# Patient Record
Sex: Female | Born: 1945 | Race: White | Hispanic: No | Smoking: Former smoker
Health system: Southern US, Community
[De-identification: ages and names within clinical notes are randomized; demographics above are authoritative.]

## PROBLEM LIST (undated history)

## (undated) DIAGNOSIS — N189 Chronic kidney disease, unspecified: Secondary | ICD-10-CM

## (undated) DIAGNOSIS — I4719 Other supraventricular tachycardia: Secondary | ICD-10-CM

## (undated) DIAGNOSIS — I471 Supraventricular tachycardia: Secondary | ICD-10-CM

## (undated) DIAGNOSIS — F32A Depression, unspecified: Secondary | ICD-10-CM

## (undated) DIAGNOSIS — I10 Essential (primary) hypertension: Secondary | ICD-10-CM

## (undated) HISTORY — PX: CARPAL TUNNEL RELEASE: SHX101

## (undated) HISTORY — PX: INSERT / REPLACE / REMOVE PACEMAKER: SUR710

## (undated) HISTORY — PX: BREAST SURGERY: SHX581

---

## 1978-05-12 HISTORY — PX: NOSE SURGERY: SHX723

## 1983-05-13 HISTORY — PX: TUBAL LIGATION: SHX77

## 2011-05-13 HISTORY — PX: BLADDER SUSPENSION: SHX72

## 2014-06-12 DIAGNOSIS — Z950 Presence of cardiac pacemaker: Secondary | ICD-10-CM

## 2014-06-12 HISTORY — DX: Presence of cardiac pacemaker: Z95.0

## 2014-06-12 HISTORY — PX: PACEMAKER INSERTION: SHX728

## 2017-01-29 HISTORY — PX: TRIGGER FINGER RELEASE: SHX641

## 2019-06-05 ENCOUNTER — Ambulatory Visit: Payer: Medicare Other | Attending: Internal Medicine

## 2019-06-05 ENCOUNTER — Ambulatory Visit: Payer: Self-pay

## 2019-06-05 DIAGNOSIS — Z23 Encounter for immunization: Secondary | ICD-10-CM | POA: Insufficient documentation

## 2019-06-05 NOTE — Progress Notes (Signed)
   Covid-19 Vaccination Clinic  Name:  Ashleymarie Granderson    MRN: [id number] DOB: 1945-09-15  06/05/2019  Ms. Boer was observed post Covid-19 immunization for 15 minutes without incidence. She was provided with Vaccine Information Sheet and instruction to access the V-Safe system.   Ms. Huckeba was instructed to call 911 with any severe reactions post vaccine: Marland Kitchen Difficulty breathing  . Swelling of your face and throat  . A fast heartbeat  . A bad rash all over your body  . Dizziness and weakness    Immunizations Administered    Name Date Dose VIS Date Route   Pfizer COVID-19 Vaccine 06/05/2019  2:28 PM 0.3 mL 04/22/2019 Intramuscular   Manufacturer: [employer], [employer]   Lot: [id number]   NDC: [address]-9

## 2019-06-27 ENCOUNTER — Ambulatory Visit: Payer: Medicare Other | Attending: Internal Medicine

## 2019-06-27 DIAGNOSIS — Z23 Encounter for immunization: Secondary | ICD-10-CM | POA: Insufficient documentation

## 2019-06-27 NOTE — Progress Notes (Signed)
   Covid-19 Vaccination Clinic  Name:  Lakshmi Sundeen    MRN: [id number] DOB: 02/24/1946  06/27/2019  Ms. Onofrio was observed post Covid-19 immunization for 15 minutes without incidence. She was provided with Vaccine Information Sheet and instruction to access the V-Safe system.   Ms. Labreck was instructed to call 911 with any severe reactions post vaccine: Marland Kitchen Difficulty breathing  . Swelling of your face and throat  . A fast heartbeat  . A bad rash all over your body  . Dizziness and weakness    Immunizations Administered    Name Date Dose VIS Date Route   Pfizer COVID-19 Vaccine 06/27/2019 10:57 AM 0.3 mL 04/22/2019 Intramuscular   Manufacturer: [employer], [employer]   Lot: [id number]   NDC: [address]-1

## 2020-05-12 DIAGNOSIS — M1990 Unspecified osteoarthritis, unspecified site: Secondary | ICD-10-CM

## 2020-05-12 HISTORY — DX: Unspecified osteoarthritis, unspecified site: M19.90

## 2020-12-20 ENCOUNTER — Other Ambulatory Visit: Payer: Self-pay | Admitting: Orthopedic Surgery

## 2020-12-20 DIAGNOSIS — M542 Cervicalgia: Secondary | ICD-10-CM

## 2020-12-24 ENCOUNTER — Other Ambulatory Visit: Payer: Self-pay

## 2020-12-24 ENCOUNTER — Ambulatory Visit
Admission: RE | Admit: 2020-12-24 | Discharge: 2020-12-24 | Disposition: A | Discharge status: Home / Self Care | Payer: Medicare Other | Source: Ambulatory Visit | Attending: Orthopedic Surgery | Admitting: Orthopedic Surgery

## 2020-12-24 DIAGNOSIS — M542 Cervicalgia: Secondary | ICD-10-CM

## 2020-12-24 MED ORDER — ONDANSETRON HCL 4 MG/2ML IJ SOLN
4.0000 mg | Freq: Once | INTRAMUSCULAR | Status: AC
  Administered 2020-12-24: 4 mg via INTRAMUSCULAR

## 2020-12-24 MED ORDER — IOPAMIDOL (ISOVUE-M 300) INJECTION 61%
10.0000 mL | Freq: Once | INTRAMUSCULAR | Status: AC | PRN
  Administered 2020-12-24: 10 mL via INTRATHECAL

## 2020-12-24 MED ORDER — MEPERIDINE HCL 50 MG/ML IJ SOLN
50.0000 mg | Freq: Once | INTRAMUSCULAR | Status: AC
Start: 2020-12-24 — End: 2020-12-24
  Administered 2020-12-24: 50 mg via INTRAMUSCULAR

## 2020-12-24 MED ORDER — DIAZEPAM 5 MG PO TABS
5.0000 mg | ORAL_TABLET | Freq: Once | ORAL | Status: AC
  Administered 2020-12-24: 5 mg via ORAL

## 2020-12-24 NOTE — Discharge Instr - Other Orders (Signed)
1345: 10/10 pain reported, see MAR.  1358: patient reported relief.

## 2020-12-24 NOTE — Discharge Instructions (Signed)

## 2021-01-21 ENCOUNTER — Other Ambulatory Visit: Payer: Self-pay | Admitting: Orthopedic Surgery

## 2021-02-13 NOTE — Pre-Procedure Instructions (Signed)
Surgical Instructions   Your procedure is scheduled on Thursday, October 13th. Report to [facility] Main Entrance "A" at 05:30 A.M., then check in with the Admitting office. Call this number if you have problems the morning of surgery: [phone]   If you have any questions prior to your surgery date call [phone]: Open Monday-Friday 8am-4pm   Remember: Do not eat after midnight the night before your surgery  You may drink clear liquids until 04:30 AM the morning of your surgery.   Clear liquids allowed are: Water, Non-Citrus Juices (without pulp), Carbonated Beverages, Clear Tea, Black Coffee Only, and Gatorade  Patient Instructions  The night before surgery:  No food after midnight. ONLY clear liquids after midnight  The day of surgery (if you do NOT have diabetes):  Drink ONE (1) Pre-Surgery Clear Ensure by 04:30 AM the morning of surgery. Drink in one sitting. Do not sip.  This drink was given to you during your hospital  pre-op appointment visit.  Nothing else to drink after completing the  Pre-Surgery Clear Ensure.          If you have questions, please contact your surgeon's office.     Take these medicines the morning of surgery with A SIP OF WATER  amLODipine (NORVASC)  acetaminophen (TYLENOL)- if needed fluticasone (FLONASE)- if needed valACYclovir (VALTREX)-    As of today, STOP taking any Aspirin (unless otherwise instructed by your surgeon) Aleve, Naproxen, Ibuprofen, Motrin, Advil, Goody's, BC's, all herbal medications, fish oil, and all vitamins.                     Do NOT Smoke (Tobacco/Vaping) or drink Alcohol 24 hours prior to your procedure.  If you use a CPAP at night, you may bring all equipment for your overnight stay.   Contacts, glasses, piercing's, hearing aid's, dentures or partials may not be worn into surgery, please bring cases for these belongings.    For patients admitted to the hospital, discharge time will be determined by your  treatment team.   Patients discharged the day of surgery will not be allowed to drive home, and someone needs to stay with them for 24 hours.  NO VISITORS WILL BE ALLOWED IN PRE-OP WHERE PATIENTS GET READY FOR SURGERY.  ONLY 1 SUPPORT PERSON MAY BE PRESENT IN THE WAITING ROOM WHILE YOU ARE IN SURGERY.  IF YOU ARE TO BE ADMITTED, ONCE YOU ARE IN YOUR ROOM YOU WILL BE ALLOWED TWO (2) VISITORS.  Minor children may have two parents present. Special consideration for safety and communication needs will be reviewed on a case by case basis.   Special instructions:   - Preparing For Surgery  Before surgery, you can play an important role. Because skin is not sterile, your skin needs to be as free of germs as possible. You can reduce the number of germs on your skin by washing with CHG (chlorahexidine gluconate) Soap before surgery.  CHG is an antiseptic cleaner which kills germs and bonds with the skin to continue killing germs even after washing.    Oral Hygiene is also important to reduce your risk of infection.  Remember - BRUSH YOUR TEETH THE MORNING OF SURGERY WITH YOUR REGULAR TOOTHPASTE  Please do not use if you have an allergy to CHG or antibacterial soaps. If your skin becomes reddened/irritated stop using the CHG.  Do not shave (including legs and underarms) for at least 48 hours prior to first CHG shower. It is OK  to shave your face.  Please follow these instructions carefully.   Shower the NIGHT BEFORE SURGERY and the MORNING OF SURGERY  If you chose to wash your hair, wash your hair first as usual with your normal shampoo.  After you shampoo, rinse your hair and body thoroughly to remove the shampoo.  Use CHG Soap as you would any other liquid soap. You can apply CHG directly to the skin and wash gently with a scrungie or a clean washcloth.   Apply the CHG Soap to your body ONLY FROM THE NECK DOWN.  Do not use on open wounds or open sores. Avoid contact with your eyes,  ears, mouth and genitals (private parts). Wash Face and genitals (private parts)  with your normal soap.   Wash thoroughly, paying special attention to the area where your surgery will be performed.  Thoroughly rinse your body with warm water from the neck down.  DO NOT shower/wash with your normal soap after using and rinsing off the CHG Soap.  Pat yourself dry with a CLEAN TOWEL.  Wear CLEAN PAJAMAS to bed the night before surgery  Place CLEAN SHEETS on your bed the night before your surgery  DO NOT SLEEP WITH PETS.   Day of Surgery: Shower with CHG soap. Do not wear jewelry, make up, nail polish, gel polish, artificial nails, or any other type of covering on natural nails including finger and toenails. If patients have artificial nails, gel coating, etc. that need to be removed by a nail salon please have this removed prior to surgery. Surgery may need to be canceled/delayed if the surgeon/ anesthesia feels like the patient is unable to be adequately monitored. Do not wear lotions, powders, perfumes, or deodorant. Men may shave face and neck. Do not bring valuables to the hospital. [facility] is not responsible for any belongings or valuables. Wear Clean/Comfortable clothing the morning of surgery Remember to brush your teeth WITH YOUR REGULAR TOOTHPASTE.   Please read over the following fact sheets that you were given.   3 days prior to your procedure or After your COVID test   You are not required to quarantine however you are required to wear a well-fitting mask when you are out and around people not in your household. If your mask becomes wet or soiled, replace with a new one.   Wash your hands often with soap and water for 20 seconds or clean your hands with an alcohol-based hand sanitizer that contains at least 60% alcohol.   Do not share personal items.   Notify your provider:  o if you are in close contact with someone who has COVID  o or if you develop a  fever of 100.4 or greater, sneezing, cough, sore throat, shortness of breath or body aches.

## 2021-02-14 ENCOUNTER — Encounter (HOSPITAL_COMMUNITY): Payer: Self-pay

## 2021-02-14 ENCOUNTER — Other Ambulatory Visit: Payer: Self-pay

## 2021-02-14 ENCOUNTER — Encounter (HOSPITAL_COMMUNITY)
Admission: RE | Admit: 2021-02-14 | Discharge: 2021-02-14 | Disposition: A | Discharge status: Home / Self Care | Payer: Medicare Other | Source: Ambulatory Visit | Attending: Orthopedic Surgery | Admitting: Orthopedic Surgery

## 2021-02-14 DIAGNOSIS — Z01818 Encounter for other preprocedural examination: Secondary | ICD-10-CM | POA: Insufficient documentation

## 2021-02-14 DIAGNOSIS — I10 Essential (primary) hypertension: Secondary | ICD-10-CM | POA: Diagnosis not present

## 2021-02-14 DIAGNOSIS — Z79899 Other long term (current) drug therapy: Secondary | ICD-10-CM | POA: Insufficient documentation

## 2021-02-14 DIAGNOSIS — Z950 Presence of cardiac pacemaker: Secondary | ICD-10-CM | POA: Diagnosis not present

## 2021-02-14 DIAGNOSIS — I471 Supraventricular tachycardia: Secondary | ICD-10-CM | POA: Insufficient documentation

## 2021-02-14 DIAGNOSIS — G9520 Unspecified cord compression: Secondary | ICD-10-CM | POA: Insufficient documentation

## 2021-02-14 DIAGNOSIS — N183 Chronic kidney disease, stage 3 unspecified: Secondary | ICD-10-CM | POA: Diagnosis not present

## 2021-02-14 DIAGNOSIS — Z87891 Personal history of nicotine dependence: Secondary | ICD-10-CM | POA: Diagnosis not present

## 2021-02-14 HISTORY — DX: Essential (primary) hypertension: I10

## 2021-02-14 HISTORY — DX: Other supraventricular tachycardia: I47.19

## 2021-02-14 HISTORY — DX: Supraventricular tachycardia: I47.1

## 2021-02-14 HISTORY — DX: Chronic kidney disease, unspecified: N18.9

## 2021-02-14 HISTORY — DX: Depression, unspecified: F32.A

## 2021-02-14 LAB — TYPE AND SCREEN
ABO/RH(D): B POS
Antibody Screen: NEGATIVE

## 2021-02-14 LAB — CBC WITH DIFFERENTIAL/PLATELET
Abs Immature Granulocytes: 0.04 10*3/uL (ref 0.00–0.07)
Basophils Absolute: 0.1 10*3/uL (ref 0.0–0.1)
Basophils Relative: 0 %
Eosinophils Absolute: 0.1 10*3/uL (ref 0.0–0.5)
Eosinophils Relative: 1 %
HCT: 42.2 % (ref 36.0–46.0)
Hemoglobin: 13.8 g/dL (ref 12.0–15.0)
Immature Granulocytes: 0 %
Lymphocytes Relative: 20 %
Lymphs Abs: 2.4 10*3/uL (ref 0.7–4.0)
MCH: 35.3 pg — ABNORMAL HIGH (ref 26.0–34.0)
MCHC: 32.7 g/dL (ref 30.0–36.0)
MCV: 107.9 fL — ABNORMAL HIGH (ref 80.0–100.0)
Monocytes Absolute: 1.2 10*3/uL — ABNORMAL HIGH (ref 0.1–1.0)
Monocytes Relative: 10 %
Neutro Abs: 8.1 10*3/uL — ABNORMAL HIGH (ref 1.7–7.7)
Neutrophils Relative %: 69 %
Platelets: 263 10*3/uL (ref 150–400)
RBC: 3.91 MIL/uL (ref 3.87–5.11)
RDW: 14.4 % (ref 11.5–15.5)
WBC: 11.9 10*3/uL — ABNORMAL HIGH (ref 4.0–10.5)
nRBC: 0 % (ref 0.0–0.2)

## 2021-02-14 LAB — APTT: aPTT: 29 seconds (ref 24–36)

## 2021-02-14 LAB — COMPREHENSIVE METABOLIC PANEL
ALT: 16 U/L (ref 0–44)
AST: 19 U/L (ref 15–41)
Albumin: 4 g/dL (ref 3.5–5.0)
Alkaline Phosphatase: 66 U/L (ref 38–126)
Anion gap: 13 (ref 5–15)
BUN: 34 mg/dL — ABNORMAL HIGH (ref 8–23)
CO2: 19 mmol/L — ABNORMAL LOW (ref 22–32)
Calcium: 9.3 mg/dL (ref 8.9–10.3)
Chloride: 102 mmol/L (ref 98–111)
Creatinine, Ser: 1.4 mg/dL — ABNORMAL HIGH (ref 0.44–1.00)
GFR, Estimated: 39 mL/min — ABNORMAL LOW (ref >60)
Glucose, Bld: 90 mg/dL (ref 70–99)
Potassium: 4.4 mmol/L (ref 3.5–5.1)
Sodium: 134 mmol/L — ABNORMAL LOW (ref 135–145)
Total Bilirubin: 0.8 mg/dL (ref 0.3–1.2)
Total Protein: 7.9 g/dL (ref 6.5–8.1)

## 2021-02-14 LAB — URINALYSIS, ROUTINE W REFLEX MICROSCOPIC
Bilirubin Urine: NEGATIVE
Glucose, UA: NEGATIVE mg/dL
Hgb urine dipstick: NEGATIVE
Ketones, ur: NEGATIVE mg/dL
Leukocytes,Ua: NEGATIVE
Nitrite: NEGATIVE
Protein, ur: NEGATIVE mg/dL
Specific Gravity, Urine: 1.009 (ref 1.005–1.030)
pH: 5 (ref 5.0–8.0)

## 2021-02-14 LAB — SURGICAL PCR SCREEN
MRSA, PCR: NEGATIVE
Staphylococcus aureus: NEGATIVE

## 2021-02-14 LAB — PROTIME-INR
INR: 1 (ref 0.8–1.2)
Prothrombin Time: 12.8 seconds (ref 11.4–15.2)

## 2021-02-14 NOTE — Progress Notes (Signed)
PCP - Daphane Shepherd, PA-C Cardiologist - Dr. Sandy Salaam  PPM/ICD - yes, Medtronic  Device Orders - not yet received. I've spoke to [place] at [facility] clinic [facility]) three times today. Device orders faxed yesterday and again today because they said they did not receive them. Larita Fife called during pt's appt and said device orders are being faxed to [place].   Rep Notified - Emailed Ivan with [employer]  Chest x-ray - pt states "years ago"  EKG - 02/14/21 at PAT appt Stress Test - 02/07/21 ECHO - 01/24/21 Cardiac Cath - 06/28/14  Sleep Study - denies  DM- denies  Blood Thinner Instructions: n/a Aspirin Instructions: n/a  ERAS Protcol - yes PRE-SURGERY Ensure given  COVID TEST- pt scheduled for testing on 02/18/21   Anesthesia review: yes, awaiting device orders; pt states cardiac clearance was given but I can't find documentation  Patient denies shortness of breath, fever, cough and chest pain at PAT appointment   All instructions explained to the patient, with a verbal understanding of the material. Patient agrees to go over the instructions while at home for a better understanding. Patient also instructed to wear a mask in public after being tested for COVID-19. The opportunity to ask questions was provided.

## 2021-02-15 NOTE — Progress Notes (Signed)
Anesthesia Chart Review:  Case: [id number] Date/Time: 02/21/21 0715   Procedure: ANTERIOR CERVICAL DECOMPRESSION FUSION CERVICAL 3- CERVICAL 4, CERVICAL 4- CERVICAL 5, CERVICAL 5- CERVICAL 6 , CERVICAL 6- CERVICAL 7 WITH INSTRUMENTATION AND ALLOGRAFT   Anesthesia type: General   Pre-op diagnosis: SPINAL CORD COMPRESSION   Location: [facility] ROOM 05 / [facility] OR   Surgeons: Estill Bamberg, MD       DISCUSSION: Patient is a 75 year old female scheduled for the above procedure.  History includes former smoker (quit 05/13/11), HTN, paroxsymal atrial tachycardia, CHB (s/p dual chamber "Medtronic" PPM 06/29/2014), CKD (Stage 3).  Last visit with EP cardiologist Dr. Rudolpho Sevin 01/22/21. She had some dyspnea, so stress and echo ordered. If results reassuring, then one year follow-up planned. He also wrote, "Interrogation of device show all value with normal range no arrhythmia in past she had paroxysmal atrial tachycardia but however she is not taking her flecainide anymore." 01/2021 stress test was non-ischemic and echo showed LVEF 60-65%, no significant valvular abnormalities.  Presurgical COVID-19 testing scheduled for 02/18/2021.  Chart to be left for staff follow-up regarding perioperative cardiac device prescription from Dr. Rudolpho Sevin with [facility]. PAT RN did notifiy [employer] Rep for surgery plans.  Anesthesia team to evaluate on the day of surgery.   VS: BP (!) 145/72   Pulse 90   Temp 36.8 C (Oral)   Ht 4\' 10"  (1.473 m)   Wt 45.4 kg   SpO2 93%   BMI 20.92 kg/m   PROVIDERS: , PA is PCP ([facility]). Last visit 12/11/20.  02/10/21, MD is cardiologist ([facility])   LABS: Labs reviewed: Acceptable for surgery. BUN 34, Cr 1.40, comparison Cr ~ 1.2-1.25 range since 12/2019 ([facility]). A1c 5.8% 12/11/20 ([facility]). (all labs ordered are listed, but only abnormal results are displayed)  Labs Reviewed  CBC WITH DIFFERENTIAL/PLATELET - Abnormal; Notable for the following  components:      Result Value   WBC 11.9 (*)    MCV 107.9 (*)    MCH 35.3 (*)    Neutro Abs 8.1 (*)    Monocytes Absolute 1.2 (*)    All other components within normal limits  COMPREHENSIVE METABOLIC PANEL - Abnormal; Notable for the following components:   Sodium 134 (*)    CO2 19 (*)    BUN 34 (*)    Creatinine, Ser 1.40 (*)    GFR, Estimated 39 (*)    All other components within normal limits  SURGICAL PCR SCREEN  PROTIME-INR  APTT  URINALYSIS, ROUTINE W REFLEX MICROSCOPIC  TYPE AND SCREEN     IMAGES: CT C-spine 12/24/20 (ordered by Dr. 12/26/20): IMPRESSION: 1. Left foraminal encroachment C2-3. Broad central protrusion without compressive pathology. 2. Right paracentral protrusion C3-4 with cord flattening, and left foraminal stenosis. 3. Spinal stenosis and cord flattening C4-5 secondary to broad central disc herniation. 4. Spinal stenosis and asymmetric cord flattening C5-6 secondary to broad right central protrusion. 5. Mild multifactorial spinal stenosis C6-7 with mild cord flattening. 6. Bilateral foraminal encroachment C7-T1. Small right paracentral protrusion without compressive pathology. 7. Bilateral carotid bifurcation plaque. EKG: 02/14/21: Atrial-paced rhythm Rightward axis Low voltage QRS Abnormal ECG No previous tracing Confirmed by Camnitz, Will (04/16/21) on 02/14/2021 4:43:25 PM   CV: Nuclear stress test 02/07/21 ([facility]): IMPRESSION:  Normal myocardial uptake both with stress and at rest.  There is no  evidence for inducible ischemia.  Normal left ventricular function with ejection fraction calculated at 96 %  Echo 01/24/21 ([facility]): SUMMARY  The left ventricular size is normal.  Left ventricular systolic function is normal. LVEF 60-65%. Left ventricular filling pattern is prolonged relaxation.  Unable to fully assess LV regional wall motion  The right ventricle is normal in size and function.  The left atrium is mildly dilated.   There is no significant valvular stenosis or regurgitation.  The aortic sinus is normal size.  IVC size was normal.  There is no pericardial effusion.  There is no comparison study available.    Past Medical History:  Diagnosis Date   Arthritis 2022   Chronic kidney disease    stage 3, per cardiology notes   Depression    Hypertension    pt states she was diagnosed "decades ago"   Paroxysmal atrial tachycardia (HCC)    Presence of permanent cardiac pacemaker 06/2014   per MD note, pacemaker for complete heart block    Past Surgical History:  Procedure Laterality Date   BLADDER SUSPENSION  2013   due to bladder prolapse, also had rectocele repair   BREAST SURGERY     augmentation 1981 revision 2015   CARPAL TUNNEL RELEASE Bilateral    left was 01/29/17, right was 05/11/1977   INSERT / REPLACE / REMOVE PACEMAKER     NOSE SURGERY  1980   deviated septum   PACEMAKER INSERTION  06/2014   TRIGGER FINGER RELEASE  01/29/2017   left ring finger   TUBAL LIGATION  1985    MEDICATIONS:  acetaminophen (TYLENOL) 500 MG tablet   amLODipine (NORVASC) 5 MG tablet   fluticasone (FLONASE) 50 MCG/ACT nasal spray   losartan (COZAAR) 100 MG tablet   valACYclovir (VALTREX) 500 MG tablet   No current facility-administered medications for this encounter.    Shonna Chock, PA-C Surgical Short Stay/Anesthesiology [facility] Phone [phone] [facility] Phone [phone] 02/15/2021 1:21 PM

## 2021-02-15 NOTE — Anesthesia Preprocedure Evaluation (Addendum)
Anesthesia Evaluation  Patient identified by MRN, date of birth, ID band Patient awake    Reviewed: Allergy & Precautions, NPO status , Patient's Chart, lab work & pertinent test results  History of Anesthesia Complications Negative for: history of anesthetic complications  Airway Mallampati: II  TM Distance: >3 FB Neck ROM: Full    Dental no notable dental hx.    Pulmonary neg pulmonary ROS, former smoker,    Pulmonary exam normal        Cardiovascular hypertension, Pt. on medications Normal cardiovascular exam+ pacemaker   Echo 01/24/21: LVEF 60-65%, mild LAE   Neuro/Psych Depression negative neurological ROS     GI/Hepatic negative GI ROS, Neg liver ROS,   Endo/Other  negative endocrine ROS  Renal/GU Renal InsufficiencyRenal disease (Cr 1.4)  negative genitourinary   Musculoskeletal  (+) Arthritis ,   Abdominal   Peds  Hematology negative hematology ROS (+)   Anesthesia Other Findings Day of surgery medications reviewed with patient.  Reproductive/Obstetrics negative OB ROS                           Anesthesia Physical Anesthesia Plan  ASA: 3  Anesthesia Plan: General   Post-op Pain Management:    Induction: Intravenous  PONV Risk Score and Plan: 3 and Treatment may vary due to age or medical condition, Ondansetron and Dexamethasone  Airway Management Planned: Oral ETT and Video Laryngoscope Planned  Additional Equipment:   Intra-op Plan:   Post-operative Plan: Extubation in OR  Informed Consent: I have reviewed the patients History and Physical, chart, labs and discussed the procedure including the risks, benefits and alternatives for the proposed anesthesia with the patient or authorized representative who has indicated his/her understanding and acceptance.     Dental advisory given  Plan Discussed with: CRNA  Anesthesia Plan Comments: (PAT note written 02/15/2021 by  Shonna Chock, PA-C. )      Anesthesia Quick Evaluation

## 2021-02-18 ENCOUNTER — Other Ambulatory Visit (HOSPITAL_COMMUNITY)
Admission: RE | Admit: 2021-02-18 | Discharge: 2021-02-18 | Disposition: A | Discharge status: Home / Self Care | Payer: Medicare Other | Source: Ambulatory Visit | Attending: Orthopedic Surgery | Admitting: Orthopedic Surgery

## 2021-02-18 DIAGNOSIS — Z20822 Contact with and (suspected) exposure to covid-19: Secondary | ICD-10-CM | POA: Insufficient documentation

## 2021-02-18 DIAGNOSIS — Z01812 Encounter for preprocedural laboratory examination: Secondary | ICD-10-CM | POA: Insufficient documentation

## 2021-02-18 LAB — SARS CORONAVIRUS 2 (TAT 6-24 HRS): SARS Coronavirus 2: NEGATIVE

## 2021-02-21 ENCOUNTER — Inpatient Hospital Stay (HOSPITAL_COMMUNITY)
Admission: RE | Disposition: A | Discharge status: Home / Self Care | Payer: Self-pay | Source: Ambulatory Visit | Attending: Orthopedic Surgery

## 2021-02-21 ENCOUNTER — Other Ambulatory Visit: Payer: Self-pay

## 2021-02-21 ENCOUNTER — Ambulatory Visit (HOSPITAL_COMMUNITY): Payer: Medicare Other

## 2021-02-21 ENCOUNTER — Ambulatory Visit (HOSPITAL_COMMUNITY): Payer: Medicare Other | Admitting: Certified Registered"

## 2021-02-21 ENCOUNTER — Ambulatory Visit (HOSPITAL_COMMUNITY): Payer: Medicare Other | Admitting: Vascular Surgery

## 2021-02-21 ENCOUNTER — Encounter (HOSPITAL_COMMUNITY): Payer: Self-pay | Admitting: Orthopedic Surgery

## 2021-02-21 ENCOUNTER — Inpatient Hospital Stay (HOSPITAL_COMMUNITY)
Admission: RE | Admit: 2021-02-21 | Discharge: 2021-02-22 | DRG: 472 | Disposition: A | Discharge status: Home / Self Care | Payer: Medicare Other | Source: Ambulatory Visit | Attending: Orthopedic Surgery | Admitting: Orthopedic Surgery

## 2021-02-21 DIAGNOSIS — I129 Hypertensive chronic kidney disease with stage 1 through stage 4 chronic kidney disease, or unspecified chronic kidney disease: Secondary | ICD-10-CM | POA: Diagnosis present

## 2021-02-21 DIAGNOSIS — M1990 Unspecified osteoarthritis, unspecified site: Secondary | ICD-10-CM | POA: Diagnosis present

## 2021-02-21 DIAGNOSIS — Z87891 Personal history of nicotine dependence: Secondary | ICD-10-CM | POA: Diagnosis not present

## 2021-02-21 DIAGNOSIS — I442 Atrioventricular block, complete: Secondary | ICD-10-CM | POA: Diagnosis present

## 2021-02-21 DIAGNOSIS — G959 Disease of spinal cord, unspecified: Secondary | ICD-10-CM | POA: Diagnosis present

## 2021-02-21 DIAGNOSIS — Z20822 Contact with and (suspected) exposure to covid-19: Secondary | ICD-10-CM | POA: Diagnosis present

## 2021-02-21 DIAGNOSIS — G992 Myelopathy in diseases classified elsewhere: Secondary | ICD-10-CM | POA: Diagnosis present

## 2021-02-21 DIAGNOSIS — M2578 Osteophyte, vertebrae: Secondary | ICD-10-CM | POA: Diagnosis present

## 2021-02-21 DIAGNOSIS — N183 Chronic kidney disease, stage 3 unspecified: Secondary | ICD-10-CM | POA: Diagnosis present

## 2021-02-21 DIAGNOSIS — Z419 Encounter for procedure for purposes other than remedying health state, unspecified: Secondary | ICD-10-CM

## 2021-02-21 DIAGNOSIS — M4802 Spinal stenosis, cervical region: Principal | ICD-10-CM | POA: Diagnosis present

## 2021-02-21 DIAGNOSIS — Z79899 Other long term (current) drug therapy: Secondary | ICD-10-CM

## 2021-02-21 DIAGNOSIS — M40209 Unspecified kyphosis, site unspecified: Secondary | ICD-10-CM | POA: Diagnosis present

## 2021-02-21 DIAGNOSIS — Z9851 Tubal ligation status: Secondary | ICD-10-CM

## 2021-02-21 DIAGNOSIS — Z950 Presence of cardiac pacemaker: Secondary | ICD-10-CM

## 2021-02-21 HISTORY — PX: ANTERIOR CERVICAL DECOMPRESSION/DISCECTOMY FUSION 4 LEVELS: SHX5556

## 2021-02-21 LAB — ABO/RH: ABO/RH(D): B POS

## 2021-02-21 SURGERY — ANTERIOR CERVICAL DECOMPRESSION/DISCECTOMY FUSION 4 LEVELS
Anesthesia: General | Site: Spine Cervical

## 2021-02-21 MED ORDER — PHENOL 1.4 % MT LIQD: 1.0000 | OROMUCOSAL | Status: DC | PRN

## 2021-02-21 MED ORDER — LOSARTAN POTASSIUM 50 MG PO TABS
100.0000 mg | ORAL_TABLET | Freq: Every day | ORAL | Status: DC
  Filled 2021-02-21: qty 2

## 2021-02-21 MED ORDER — PHENYLEPHRINE HCL (PRESSORS) 10 MG/ML IV SOLN
INTRAVENOUS | Status: DC | PRN
  Administered 2021-02-21 (×4): 80 ug via INTRAVENOUS

## 2021-02-21 MED ORDER — FLEET ENEMA 7-19 GM/118ML RE ENEM
1.0000 | ENEMA | Freq: Once | RECTAL | Status: DC | PRN
Start: 2021-02-21 — End: 2021-02-22

## 2021-02-21 MED ORDER — LIDOCAINE 2% (20 MG/ML) 5 ML SYRINGE
INTRAMUSCULAR | Status: DC | PRN
  Administered 2021-02-21: 60 mg via INTRAVENOUS

## 2021-02-21 MED ORDER — FENTANYL CITRATE (PF) 100 MCG/2ML IJ SOLN
INTRAMUSCULAR | Status: DC | PRN
  Administered 2021-02-21 (×5): 50 ug via INTRAVENOUS

## 2021-02-21 MED ORDER — LIDOCAINE 2% (20 MG/ML) 5 ML SYRINGE
INTRAMUSCULAR | Status: AC
  Filled 2021-02-21: qty 5

## 2021-02-21 MED ORDER — DEXAMETHASONE SODIUM PHOSPHATE 10 MG/ML IJ SOLN
INTRAMUSCULAR | Status: AC
  Filled 2021-02-21: qty 1

## 2021-02-21 MED ORDER — MENTHOL 3 MG MT LOZG: 1.0000 | LOZENGE | OROMUCOSAL | Status: DC | PRN

## 2021-02-21 MED ORDER — SODIUM CHLORIDE 0.9% FLUSH: 3.0000 mL | INTRAVENOUS | Status: DC | PRN

## 2021-02-21 MED ORDER — SENNOSIDES-DOCUSATE SODIUM 8.6-50 MG PO TABS: 1.0000 | ORAL_TABLET | Freq: Every evening | ORAL | Status: DC | PRN

## 2021-02-21 MED ORDER — ONDANSETRON HCL 4 MG/2ML IJ SOLN
INTRAMUSCULAR | Status: DC | PRN
  Administered 2021-02-21: 4 mg via INTRAVENOUS

## 2021-02-21 MED ORDER — POVIDONE-IODINE 7.5 % EX SOLN: Freq: Once | CUTANEOUS | Status: DC

## 2021-02-21 MED ORDER — ROCURONIUM BROMIDE 10 MG/ML (PF) SYRINGE
PREFILLED_SYRINGE | INTRAVENOUS | Status: AC
  Filled 2021-02-21: qty 10

## 2021-02-21 MED ORDER — METHOCARBAMOL 500 MG PO TABS
500.0000 mg | ORAL_TABLET | Freq: Four times a day (QID) | ORAL | Status: DC | PRN
  Administered 2021-02-21: 500 mg via ORAL
  Filled 2021-02-21: qty 1

## 2021-02-21 MED ORDER — OXYCODONE HCL 5 MG/5ML PO SOLN: 5.0000 mg | Freq: Once | ORAL | Status: DC | PRN

## 2021-02-21 MED ORDER — FLUTICASONE PROPIONATE 50 MCG/ACT NA SUSP
2.0000 | Freq: Every day | NASAL | Status: DC | PRN
  Filled 2021-02-21: qty 16

## 2021-02-21 MED ORDER — SUGAMMADEX SODIUM 200 MG/2ML IV SOLN
INTRAVENOUS | Status: DC | PRN
  Administered 2021-02-21: 200 mg via INTRAVENOUS

## 2021-02-21 MED ORDER — ONDANSETRON HCL 4 MG/2ML IJ SOLN: 4.0000 mg | Freq: Four times a day (QID) | INTRAMUSCULAR | Status: DC | PRN

## 2021-02-21 MED ORDER — AMLODIPINE BESYLATE 5 MG PO TABS: 5.0000 mg | ORAL_TABLET | Freq: Every day | ORAL | Status: DC

## 2021-02-21 MED ORDER — ARTIFICIAL TEARS OPHTHALMIC OINT
TOPICAL_OINTMENT | OPHTHALMIC | Status: AC
  Filled 2021-02-21: qty 3.5

## 2021-02-21 MED ORDER — CEFAZOLIN SODIUM-DEXTROSE 2-4 GM/100ML-% IV SOLN
2.0000 g | INTRAVENOUS | Status: AC
  Administered 2021-02-21: 2 g via INTRAVENOUS
  Filled 2021-02-21: qty 100

## 2021-02-21 MED ORDER — THROMBIN 20000 UNITS EX SOLR
CUTANEOUS | Status: DC | PRN
  Administered 2021-02-21: 20000 [IU] via TOPICAL

## 2021-02-21 MED ORDER — SODIUM CHLORIDE 0.9 % IV SOLN: 250.0000 mL | INTRAVENOUS | Status: DC

## 2021-02-21 MED ORDER — ORAL CARE MOUTH RINSE: 15.0000 mL | Freq: Once | OROMUCOSAL | Status: AC

## 2021-02-21 MED ORDER — PROPOFOL 10 MG/ML IV BOLUS
INTRAVENOUS | Status: DC | PRN
  Administered 2021-02-21: 80 mg via INTRAVENOUS

## 2021-02-21 MED ORDER — ONDANSETRON HCL 4 MG/2ML IJ SOLN
INTRAMUSCULAR | Status: AC
  Filled 2021-02-21: qty 2

## 2021-02-21 MED ORDER — METHOCARBAMOL 1000 MG/10ML IJ SOLN
500.0000 mg | Freq: Four times a day (QID) | INTRAVENOUS | Status: DC | PRN
  Filled 2021-02-21: qty 5

## 2021-02-21 MED ORDER — LACTATED RINGERS IV SOLN: INTRAVENOUS | Status: DC

## 2021-02-21 MED ORDER — KETAMINE HCL 50 MG/5ML IJ SOSY
PREFILLED_SYRINGE | INTRAMUSCULAR | Status: AC
  Filled 2021-02-21: qty 5

## 2021-02-21 MED ORDER — SODIUM CHLORIDE 0.9 % IV SOLN
INTRAVENOUS | Status: DC | PRN
  Administered 2021-02-21: 25 ug/min via INTRAVENOUS

## 2021-02-21 MED ORDER — OXYCODONE-ACETAMINOPHEN 5-325 MG PO TABS: 1.0000 | ORAL_TABLET | ORAL | 0 refills | Status: AC | PRN

## 2021-02-21 MED ORDER — DEXAMETHASONE SODIUM PHOSPHATE 10 MG/ML IJ SOLN
INTRAMUSCULAR | Status: DC | PRN
  Administered 2021-02-21: 5 mg via INTRAVENOUS

## 2021-02-21 MED ORDER — METHOCARBAMOL 500 MG PO TABS: 500.0000 mg | ORAL_TABLET | Freq: Four times a day (QID) | ORAL | 2 refills | Status: AC | PRN

## 2021-02-21 MED ORDER — MORPHINE SULFATE (PF) 2 MG/ML IV SOLN: 1.0000 mg | INTRAVENOUS | Status: DC | PRN

## 2021-02-21 MED ORDER — BUPIVACAINE-EPINEPHRINE 0.25% -1:200000 IJ SOLN
INTRAMUSCULAR | Status: DC | PRN
  Administered 2021-02-21: 4 mL

## 2021-02-21 MED ORDER — ACETAMINOPHEN 10 MG/ML IV SOLN
INTRAVENOUS | Status: AC
  Filled 2021-02-21: qty 100

## 2021-02-21 MED ORDER — DEXMEDETOMIDINE (PRECEDEX) IN NS 20 MCG/5ML (4 MCG/ML) IV SYRINGE
PREFILLED_SYRINGE | INTRAVENOUS | Status: DC | PRN
  Administered 2021-02-21 (×5): 4 ug via INTRAVENOUS

## 2021-02-21 MED ORDER — ROCURONIUM BROMIDE 10 MG/ML (PF) SYRINGE
PREFILLED_SYRINGE | INTRAVENOUS | Status: DC | PRN
  Administered 2021-02-21: 10 mg via INTRAVENOUS
  Administered 2021-02-21: 5 mg via INTRAVENOUS
  Administered 2021-02-21: 40 mg via INTRAVENOUS
  Administered 2021-02-21: 20 mg via INTRAVENOUS

## 2021-02-21 MED ORDER — ACETAMINOPHEN 10 MG/ML IV SOLN
INTRAVENOUS | Status: DC | PRN
  Administered 2021-02-21: 1000 mg via INTRAVENOUS

## 2021-02-21 MED ORDER — DEXMEDETOMIDINE (PRECEDEX) IN NS 20 MCG/5ML (4 MCG/ML) IV SYRINGE
PREFILLED_SYRINGE | INTRAVENOUS | Status: AC
  Filled 2021-02-21: qty 5

## 2021-02-21 MED ORDER — THROMBIN (RECOMBINANT) 20000 UNITS EX SOLR
CUTANEOUS | Status: AC
  Filled 2021-02-21: qty 20000

## 2021-02-21 MED ORDER — BUPIVACAINE-EPINEPHRINE (PF) 0.25% -1:200000 IJ SOLN
INTRAMUSCULAR | Status: AC
  Filled 2021-02-21: qty 30

## 2021-02-21 MED ORDER — ALUM & MAG HYDROXIDE-SIMETH 200-200-20 MG/5ML PO SUSP: 30.0000 mL | Freq: Four times a day (QID) | ORAL | Status: DC | PRN

## 2021-02-21 MED ORDER — SODIUM CHLORIDE 0.9% FLUSH: 3.0000 mL | Freq: Two times a day (BID) | INTRAVENOUS | Status: DC

## 2021-02-21 MED ORDER — ONDANSETRON HCL 4 MG PO TABS: 4.0000 mg | ORAL_TABLET | Freq: Four times a day (QID) | ORAL | Status: DC | PRN

## 2021-02-21 MED ORDER — ACETAMINOPHEN 650 MG RE SUPP: 650.0000 mg | RECTAL | Status: DC | PRN

## 2021-02-21 MED ORDER — MIDAZOLAM HCL 2 MG/2ML IJ SOLN
INTRAMUSCULAR | Status: AC
  Filled 2021-02-21: qty 2

## 2021-02-21 MED ORDER — CHLORHEXIDINE GLUCONATE 0.12 % MT SOLN
15.0000 mL | Freq: Once | OROMUCOSAL | Status: AC
  Administered 2021-02-21: 15 mL via OROMUCOSAL
  Filled 2021-02-21: qty 15

## 2021-02-21 MED ORDER — OXYCODONE HCL 5 MG PO TABS: 5.0000 mg | ORAL_TABLET | Freq: Once | ORAL | Status: DC | PRN

## 2021-02-21 MED ORDER — 0.9 % SODIUM CHLORIDE (POUR BTL) OPTIME
TOPICAL | Status: DC | PRN
  Administered 2021-02-21: 1000 mL

## 2021-02-21 MED ORDER — OXYCODONE-ACETAMINOPHEN 5-325 MG PO TABS
1.0000 | ORAL_TABLET | ORAL | Status: DC | PRN
  Administered 2021-02-21: 1 via ORAL
  Filled 2021-02-21: qty 1

## 2021-02-21 MED ORDER — ACETAMINOPHEN 325 MG PO TABS
650.0000 mg | ORAL_TABLET | ORAL | Status: DC | PRN
  Administered 2021-02-21 – 2021-02-22 (×2): 650 mg via ORAL
  Filled 2021-02-21 (×2): qty 2

## 2021-02-21 MED ORDER — ARTIFICIAL TEARS OPHTHALMIC OINT
TOPICAL_OINTMENT | OPHTHALMIC | Status: DC | PRN
  Administered 2021-02-21: 1 via OPHTHALMIC

## 2021-02-21 MED ORDER — FENTANYL CITRATE (PF) 100 MCG/2ML IJ SOLN
25.0000 ug | INTRAMUSCULAR | Status: DC | PRN
  Administered 2021-02-21 (×2): 25 ug via INTRAVENOUS

## 2021-02-21 MED ORDER — DOCUSATE SODIUM 100 MG PO CAPS
100.0000 mg | ORAL_CAPSULE | Freq: Two times a day (BID) | ORAL | Status: DC
  Filled 2021-02-21: qty 1

## 2021-02-21 MED ORDER — HEMOSTATIC AGENTS (NO CHARGE) OPTIME
TOPICAL | Status: DC | PRN
  Administered 2021-02-21 (×2): 1 via TOPICAL

## 2021-02-21 MED ORDER — FENTANYL CITRATE (PF) 250 MCG/5ML IJ SOLN
INTRAMUSCULAR | Status: AC
  Filled 2021-02-21: qty 5

## 2021-02-21 MED ORDER — CEFAZOLIN SODIUM-DEXTROSE 2-4 GM/100ML-% IV SOLN
2.0000 g | Freq: Three times a day (TID) | INTRAVENOUS | Status: AC
Start: 2021-02-21 — End: 2021-02-21
  Administered 2021-02-21 (×2): 2 g via INTRAVENOUS
  Filled 2021-02-21 (×2): qty 100

## 2021-02-21 MED ORDER — LOSARTAN POTASSIUM 50 MG PO TABS
100.0000 mg | ORAL_TABLET | ORAL | Status: AC
  Administered 2021-02-21: 100 mg via ORAL

## 2021-02-21 MED ORDER — MIDAZOLAM HCL 5 MG/5ML IJ SOLN
INTRAMUSCULAR | Status: DC | PRN
  Administered 2021-02-21: 2 mg via INTRAVENOUS

## 2021-02-21 MED ORDER — POTASSIUM CHLORIDE IN NACL 20-0.9 MEQ/L-% IV SOLN: INTRAVENOUS | Status: DC

## 2021-02-21 MED ORDER — PROPOFOL 10 MG/ML IV BOLUS
INTRAVENOUS | Status: AC
  Filled 2021-02-21: qty 40

## 2021-02-21 MED ORDER — SODIUM CHLORIDE 0.9 % IV SOLN: INTRAVENOUS | Status: DC | PRN

## 2021-02-21 MED ORDER — ZOLPIDEM TARTRATE 5 MG PO TABS: 5.0000 mg | ORAL_TABLET | Freq: Every evening | ORAL | Status: DC | PRN

## 2021-02-21 MED ORDER — VALACYCLOVIR HCL 500 MG PO TABS
500.0000 mg | ORAL_TABLET | Freq: Two times a day (BID) | ORAL | Status: DC | PRN
  Filled 2021-02-21: qty 1

## 2021-02-21 MED ORDER — BISACODYL 5 MG PO TBEC: 5.0000 mg | DELAYED_RELEASE_TABLET | Freq: Every day | ORAL | Status: DC | PRN

## 2021-02-21 MED ORDER — FENTANYL CITRATE (PF) 100 MCG/2ML IJ SOLN
INTRAMUSCULAR | Status: AC
  Filled 2021-02-21: qty 2

## 2021-02-21 SURGICAL SUPPLY — 78 items
AGENT HMST KT MTR STRL THRMB (HEMOSTASIS) ×1
APL SKNCLS STERI-STRIP NONHPOA (GAUZE/BANDAGES/DRESSINGS)
BAG COUNTER SPONGE SURGICOUNT (BAG) ×2 IMPLANT
BAG SPNG CNTER NS LX DISP (BAG) ×1
BENZOIN TINCTURE PRP APPL 2/3 (GAUZE/BANDAGES/DRESSINGS) ×1 IMPLANT
BIT DRILL NEURO 2X3.1 SFT TUCH (MISCELLANEOUS) ×1 IMPLANT
BIT DRILL SKYLINE 12MM (BIT) IMPLANT
BLADE SURG 15 STRL LF DISP TIS (BLADE) ×1 IMPLANT
BLADE SURG 15 STRL SS (BLADE) ×2
BONE VIVIGEN FORMABLE 1.3CC (Bone Implant) ×4 IMPLANT
BUR MATCHSTICK NEURO 3.0 LAGG (BURR) IMPLANT
CARTRIDGE OIL MAESTRO DRILL (MISCELLANEOUS) ×1 IMPLANT
CLOSURE STERI-STRIP 1/4X4 (GAUZE/BANDAGES/DRESSINGS) ×1 IMPLANT
COVER SURGICAL LIGHT HANDLE (MISCELLANEOUS) ×2 IMPLANT
DIFFUSER DRILL AIR PNEUMATIC (MISCELLANEOUS) ×2 IMPLANT
DRAIN JACKSON RD 7FR 3/32 (WOUND CARE) IMPLANT
DRAPE C-ARM 42X72 X-RAY (DRAPES) ×2 IMPLANT
DRAPE POUCH INSTRU U-SHP 10X18 (DRAPES) ×2 IMPLANT
DRAPE SURG 17X23 STRL (DRAPES) ×6 IMPLANT
DRILL BIT SKYLINE 12MM (BIT) ×2
DRILL NEURO 2X3.1 SOFT TOUCH (MISCELLANEOUS) ×2
DURAPREP 26ML APPLICATOR (WOUND CARE) ×2 IMPLANT
ELECT COATED BLADE 2.86 ST (ELECTRODE) ×2 IMPLANT
ELECT REM PT RETURN 9FT ADLT (ELECTROSURGICAL) ×2
ELECTRODE REM PT RTRN 9FT ADLT (ELECTROSURGICAL) ×1 IMPLANT
EVACUATOR SILICONE 100CC (DRAIN) IMPLANT
GAUZE 4X4 16PLY ~~LOC~~+RFID DBL (SPONGE) ×2 IMPLANT
GAUZE SPONGE 4X4 12PLY STRL (GAUZE/BANDAGES/DRESSINGS) ×2 IMPLANT
GLOVE SRG 8 PF TXTR STRL LF DI (GLOVE) ×1 IMPLANT
GLOVE SURG ENC MOIS LTX SZ6.5 (GLOVE) ×2 IMPLANT
GLOVE SURG ENC MOIS LTX SZ8 (GLOVE) ×2 IMPLANT
GLOVE SURG UNDER POLY LF SZ7 (GLOVE) ×4 IMPLANT
GLOVE SURG UNDER POLY LF SZ8 (GLOVE) ×2
GOWN STRL REUS W/ TWL LRG LVL3 (GOWN DISPOSABLE) ×1 IMPLANT
GOWN STRL REUS W/ TWL XL LVL3 (GOWN DISPOSABLE) ×1 IMPLANT
GOWN STRL REUS W/TWL LRG LVL3 (GOWN DISPOSABLE) ×2
GOWN STRL REUS W/TWL XL LVL3 (GOWN DISPOSABLE) ×2
GRAFT BNE MATRIX VG FRMBL SM 1 (Bone Implant) IMPLANT
INTERLOCK LRDTC CRVCL VBR 6MM (Bone Implant) IMPLANT
INTERLOCK LRDTC CRVCL VBR 7MM (Bone Implant) IMPLANT
INTERLOCK LRDTC CRVCL VBR SM (Bone Implant) IMPLANT
IV CATH 14GX2 1/4 (CATHETERS) ×2 IMPLANT
KIT BASIN OR (CUSTOM PROCEDURE TRAY) ×2 IMPLANT
KIT TURNOVER KIT B (KITS) ×2 IMPLANT
LORDOTIC CERVICAL VBR 6MM SM (Bone Implant) ×2 IMPLANT
LORDOTIC CERVICAL VBR 7MM SM (Bone Implant) ×2 IMPLANT
LORDOTIC CERVICAL VBR SM 5MM (Bone Implant) ×4 IMPLANT
MANIFOLD NEPTUNE II (INSTRUMENTS) ×2 IMPLANT
NDL PRECISIONGLIDE 27X1.5 (NEEDLE) ×1 IMPLANT
NDL SPNL 20GX3.5 QUINCKE YW (NEEDLE) ×1 IMPLANT
NEEDLE PRECISIONGLIDE 27X1.5 (NEEDLE) ×2 IMPLANT
NEEDLE SPNL 20GX3.5 QUINCKE YW (NEEDLE) ×2 IMPLANT
NS IRRIG 1000ML POUR BTL (IV SOLUTION) ×2 IMPLANT
OIL CARTRIDGE MAESTRO DRILL (MISCELLANEOUS) ×2
PACK ORTHO CERVICAL (CUSTOM PROCEDURE TRAY) ×2 IMPLANT
PAD ARMBOARD 7.5X6 YLW CONV (MISCELLANEOUS) ×4 IMPLANT
PATTIES SURGICAL .5 X.5 (GAUZE/BANDAGES/DRESSINGS) ×1 IMPLANT
PATTIES SURGICAL .5 X1 (DISPOSABLE) IMPLANT
PIN DISTRACTION 14 (PIN) ×2 IMPLANT
PLATE 56MM SKYLINE (Plate) ×1 IMPLANT
POSITIONER HEAD DONUT 9IN (MISCELLANEOUS) ×2 IMPLANT
SCREW SKYLINE VARIABLE LG (Screw) ×10 IMPLANT
SPONGE INTESTINAL PEANUT (DISPOSABLE) ×5 IMPLANT
SPONGE SURGIFOAM ABS GEL 100 (HEMOSTASIS) ×2 IMPLANT
STRIP CLOSURE SKIN 1/2X4 (GAUZE/BANDAGES/DRESSINGS) ×1 IMPLANT
SURGIFLO W/THROMBIN 8M KIT (HEMOSTASIS) ×1 IMPLANT
SUT MNCRL AB 4-0 PS2 18 (SUTURE) ×2 IMPLANT
SUT SILK 2 0 TIES 10X30 (SUTURE) ×1 IMPLANT
SUT VIC AB 2-0 CT2 18 VCP726D (SUTURE) ×2 IMPLANT
SYR BULB IRRIG 60ML STRL (SYRINGE) ×2 IMPLANT
SYR CONTROL 10ML LL (SYRINGE) ×4 IMPLANT
TAPE CLOTH 4X10 WHT NS (GAUZE/BANDAGES/DRESSINGS) ×2 IMPLANT
TAPE UMBILICAL COTTON 1/8X30 (MISCELLANEOUS) ×2 IMPLANT
TOWEL GREEN STERILE (TOWEL DISPOSABLE) ×2 IMPLANT
TOWEL GREEN STERILE FF (TOWEL DISPOSABLE) ×2 IMPLANT
TRAY FOLEY MTR SLVR 16FR STAT (SET/KITS/TRAYS/PACK) ×2 IMPLANT
WATER STERILE IRR 1000ML POUR (IV SOLUTION) ×2 IMPLANT
YANKAUER SUCT BULB TIP NO VENT (SUCTIONS) ×2 IMPLANT

## 2021-02-21 NOTE — Transfer of Care (Signed)
Immediate Anesthesia Transfer of Care Note  Patient: Shelby Nelson  Procedure(s) Performed: ANTERIOR CERVICAL DECOMPRESSION FUSION CERVICAL 3- CERVICAL 4, CERVICAL 4- CERVICAL 5, CERVICAL 5- CERVICAL 6 , CERVICAL 6- CERVICAL 7 WITH INSTRUMENTATION AND ALLOGRAFT (Spine Cervical)  Patient Location: PACU  Anesthesia Type:General  Level of Consciousness: awake, alert  and oriented  Airway & Oxygen Therapy: Patient Spontanous Breathing and Patient connected to face mask oxygen  Post-op Assessment: Report given to RN and Post -op Vital signs reviewed and stable  Post vital signs: Reviewed and stable  Last Vitals:  Vitals Value Taken Time  BP 116/67 02/21/21 1149  Temp    Pulse 77 02/21/21 1156  Resp 21 02/21/21 1156  SpO2 95 % 02/21/21 1156  Vitals shown include unvalidated device data.  Last Pain:  Vitals:   02/21/21 0626  TempSrc:   PainSc: 5          Complications: No notable events documented.

## 2021-02-21 NOTE — Anesthesia Procedure Notes (Signed)
Procedure Name: Intubation Date/Time: 02/21/2021 7:43 AM Performed by: Lendon Ka, CRNA Pre-anesthesia Checklist: Patient identified, Emergency Drugs available, Suction available and Patient being monitored Patient Re-evaluated:Patient Re-evaluated prior to induction Oxygen Delivery Method: Circle System Utilized Preoxygenation: Pre-oxygenation with 100% oxygen Induction Type: IV induction Ventilation: Mask ventilation without difficulty Laryngoscope Size: Glidescope and 3 Grade View: Grade I Tube type: Oral Tube size: 6.5 mm Number of attempts: 1 Airway Equipment and Method: Stylet and Oral airway Placement Confirmation: ETT inserted through vocal cords under direct vision, positive ETCO2 and breath sounds checked- equal and bilateral Secured at: 20 cm Tube secured with: Tape Dental Injury: Teeth and Oropharynx as per pre-operative assessment  Comments: With ease.

## 2021-02-21 NOTE — Progress Notes (Signed)
No device orders received back from clinic about pt's PPM. Per Dr. Stephannie Peters, okay for magnet to be used during surgery this morning.

## 2021-02-21 NOTE — H&P (Addendum)
PREOPERATIVE H&P  Chief Complaint: Left arm weakness  HPI: Shelby Nelson is a 75 y.o. female who presents with progressive left arm weakness  MRI reveals spinal cord compression spanning C3-C7  P(see office notes for additional details regarding the patient's full course of treatment)  Past Medical History:  Diagnosis Date   Arthritis 2022   Chronic kidney disease    stage 3, per cardiology notes   Depression    Hypertension    pt states she was diagnosed "decades ago"   Paroxysmal atrial tachycardia (HCC)    Presence of permanent cardiac pacemaker 06/2014   per MD note, pacemaker for complete heart block   Past Surgical History:  Procedure Laterality Date   BLADDER SUSPENSION  2013   due to bladder prolapse, also had rectocele repair   BREAST SURGERY     augmentation 1981 revision 2015   CARPAL TUNNEL RELEASE Bilateral    left was 01/29/17, right was 05/11/1977   INSERT / REPLACE / REMOVE PACEMAKER     NOSE SURGERY  1980   deviated septum   PACEMAKER INSERTION  06/2014   TRIGGER FINGER RELEASE  01/29/2017   left ring finger   TUBAL LIGATION  1985   Social History   Socioeconomic History   Marital status: Widowed    Spouse name: Not on file   Number of children: Not on file   Years of education: Not on file   Highest education level: Not on file  Occupational History   Not on file  Tobacco Use   Smoking status: Former    Types: Cigarettes    Quit date: 2013    Years since quitting: 9.7   Smokeless tobacco: Never  Vaping Use   Vaping Use: Never used  Substance and Sexual Activity   Alcohol use: Yes    Alcohol/week: 8.0 - 10.0 standard drinks    Types: 8 - 10 Glasses of wine per week   Drug use: Never   Sexual activity: Not on file  Other Topics Concern   Not on file  Social History Narrative   Not on file   Social Determinants of Health   Financial Resource Strain: Not on file  Food Insecurity: Not on file  Transportation Needs: Not on file   Physical Activity: Not on file  Stress: Not on file  Social Connections: Not on file   History reviewed. No pertinent family history. No Known Allergies Prior to Admission medications   Medication Sig Start Date End Date Taking? Authorizing Provider  acetaminophen (TYLENOL) 500 MG tablet Take 1,000 mg by mouth every 8 (eight) hours as needed for moderate pain.   Yes [provider]  amLODipine (NORVASC) 5 MG tablet Take 5 mg by mouth daily.   Yes [provider]  fluticasone (FLONASE) 50 MCG/ACT nasal spray Place 2 sprays into both nostrils daily as needed for allergies or rhinitis.   Yes [provider]  losartan (COZAAR) 100 MG tablet Take 100 mg by mouth daily.   Yes [provider]  valACYclovir (VALTREX) 500 MG tablet Take 500 mg by mouth 2 (two) times daily as needed (shingles).   Yes [provider]     All other systems have been reviewed and were otherwise negative with the exception of those mentioned in the HPI and as above.  Physical Exam: Vitals:   02/21/21 0606  BP: (!) 181/72  Pulse: 73  Resp: 18  Temp: (!) 97.5 F (36.4 C)  SpO2:  98%    Body mass index is 20.48 kg/m.  General: Alert, no acute distress Cardiovascular: No pedal edema Respiratory: No cyanosis, no use of accessory musculature Skin: No lesions in the area of chief complaint Neurologic: Sensation intact distally Psychiatric: Patient is competent for consent with normal mood and affect Lymphatic: No axillary or cervical lymphadenopathy   Assessment/Plan: SPINAL CORD COMPRESSION, CERVICAL MYELOPATHY Plan for Procedure(s): ANTERIOR CERVICAL DECOMPRESSION FUSION CERVICAL 3- CERVICAL 4, CERVICAL 4- CERVICAL 5, CERVICAL 5- CERVICAL 6 , CERVICAL 6- CERVICAL 7 WITH INSTRUMENTATION AND ALLOGRAFT   Jackelyn Hoehn, MD 02/21/2021 6:16 AM

## 2021-02-21 NOTE — Anesthesia Postprocedure Evaluation (Signed)
Anesthesia Post Note  Patient: Lakin Rhine  Procedure(s) Performed: ANTERIOR CERVICAL DECOMPRESSION FUSION CERVICAL 3- CERVICAL 4, CERVICAL 4- CERVICAL 5, CERVICAL 5- CERVICAL 6 , CERVICAL 6- CERVICAL 7 WITH INSTRUMENTATION AND ALLOGRAFT (Spine Cervical)     Patient location during evaluation: PACU Anesthesia Type: General Level of consciousness: awake and alert and oriented Pain management: pain level controlled Vital Signs Assessment: post-procedure vital signs reviewed and stable Respiratory status: spontaneous breathing, nonlabored ventilation and respiratory function stable Cardiovascular status: blood pressure returned to baseline Postop Assessment: no apparent nausea or vomiting Anesthetic complications: no   No notable events documented.  Last Vitals:  Vitals:   02/21/21 1245 02/21/21 1303  BP: (!) 147/78 (!) 164/70  Pulse: 72 72  Resp: 13 18  Temp: (!) 36.1 C   SpO2: 97% 95%    Last Pain:  Vitals:   02/21/21 1310  TempSrc:   PainSc: 2                  Shanda Howells

## 2021-02-21 NOTE — Op Note (Signed)
PATIENT NAME: Shelby Nelson   MEDICAL RECORD NO.:   [id number]    DATE OF BIRTH: December 17, 1945   DATE OF PROCEDURE: 02/21/2021                                OPERATIVE REPORT     PREOPERATIVE DIAGNOSES: 1. Progressive cervical myelopathy 2. 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   POSTOPERATIVE DIAGNOSES: 1. Progressive cervical myelopathy 2. 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   PROCEDURE: 1. Anterior cervical decompression and fusion C3/4, C4/5, C5/6, C6/7. 2. Placement of anterior instrumentation, C3-C7. 3. Insertion of interbody device x 4 (Titan intervertebral spacers). 4. Intraoperative use of fluoroscopy. 5. Use of morselized allograft - ViviGen.   SURGEON:  Estill Bamberg, MD   ASSISTANT:  Skip Mayer, PA-C.   ANESTHESIA:  General endotracheal anesthesia.   COMPLICATIONS:  None.   DISPOSITION:  Stable.   ESTIMATED BLOOD LOSS:  Minimal.   INDICATIONS FOR SURGERY:  Briefly, [name] is a pleasant 75 y.o. year-old female, who did present to me with progressive weakness in her left arm. The patient's MRI did reveal the findings noted above, specifically, very prominent spinal cord compression at multiple levels, in addition to neuroforaminal stenosis.  Given the patient's progressive symptoms and MRI findings, we did discuss proceeding with the procedure noted above.  The patient was fully aware of the risks and limitations of surgery as outlined in my preoperative note.   OPERATIVE DETAILS:  On 02/21/2021, the patient was brought to surgery and general endotracheal anesthesia was administered.  The patient was placed supine on the hospital bed. The neck was gently extended.  All bony prominences were meticulously padded.  The neck was prepped and draped in the usual sterile fashion.  At this point, I did make a left-sided oblique incision.  The platysma was incised.  A Smith-Robinson approach was used and the anterior spine was identified. A self-retaining retractor was placed.   I then subperiosteally exposed the vertebral bodies from C3-C7.  Prominent osteophytes noted anteriorly at multiple levels, which were removed using rongeurs.  Caspar pins were then placed into the C6 and C7 vertebral bodies and distraction was applied.  A thorough and complete C6-7 intervertebral diskectomy was performed.  The posterior longitudinal ligament was identified and entered using a nerve hook.  I then used #1 followed by #2 Kerrison to perform a thorough and complete intervertebral diskectomy.  The spinal canal was thoroughly decompressed, as was the right and left neuroforamen.  The endplates were then prepared and the appropriate-sized intervertebral spacer was then packed with ViviGen and tamped into position in the usual fashion.  The lower Caspar pin was then removed and placed into the C5 vertebral body and once again, distraction was applied across the C5-6 intervertebral space.  I then again performed a thorough and complete diskectomy, thoroughly decompressing the spinal canal and bilateral neuroforamena.  After preparing the endplates, the appropriate-sized intervertebral spacer was packed with ViviGen and tamped into position.  The lower Caspar pin was then removed and placed into the C4 vertebral body and once again, distraction was applied across the C4-5 intervertebral space.  I then again performed a thorough and complete diskectomy, thoroughly decompressing the spinal canal and bilateral neuroforamena.  After preparing the endplates, the appropriate-sized intervertebral spacer was packed with ViviGen and tamped into position.  The lower Caspar pin was then removed and placed into the C3 vertebral body and once again, distraction was  applied across the C3-4 intervertebral space.  I then again performed a thorough and complete diskectomy, thoroughly decompressing the spinal canal and bilateral neuroforamena.  After preparing the endplates, the appropriate-sized  intervertebral spacer was packed with ViviGen and tamped into position.  The Caspar pins then were removed and bone wax was placed in their place.  The appropriate-sized anterior cervical plate was placed over the anterior spine.  Of note, the patient did have a rather kyphotic cervical alignment, and the plate did need to be placed into the appropriate degree of kyphosis, which was performed using benders. 12 mm variable angle screws were placed, 2 in each vertebral body from C3-C7 for a total of 10 vertebral body screws.  The screws were then locked to the plate using the Cam locking mechanism.  I was very pleased with the final fluoroscopic images.  The wound was then irrigated.  The wound was then explored for any undue bleeding and there was no bleeding noted. The wound was then closed in layers using 2-0 Vicryl, followed by 4-0 Monocryl.  Benzoin and Steri-Strips were applied, followed by sterile dressing.  All instrument counts were correct at the termination of the procedure.   Of note, Skip Mayer, PA-C, was my assistant throughout surgery, and did aid in retraction, suctioning, placement of the hardware, and closure throughout the surgery.       Estill Bamberg, MD

## 2021-02-22 MED FILL — Thrombin (Recombinant) For Soln 20000 Unit: CUTANEOUS | Qty: 1 | Status: AC

## 2021-02-22 NOTE — Plan of Care (Signed)
Pt doing well. Pt and daughter given D/C instructions with verbal understanding. Rx's were sent to the pharmacy by MD. Pt's incision is clean and dry with no sign of infection. Pt's IV was removed prior to D/C. Pt D/C'd home via wheelchair per MD order. Pt is stable @ D/C and has no other needs at this time. Ethne Jeon, RN  

## 2021-02-22 NOTE — Progress Notes (Signed)
    Patient doing well  Denies arm pain Tolerating PO well   Physical Exam: Vitals:   02/21/21 2301 02/22/21 0339  BP: (!) 151/86 (!) 179/79  Pulse: 76 69  Resp: 18 18  Temp: 97.8 F (36.6 C) 98.3 F (36.8 C)  SpO2: 93% 93%    Neck soft/supple Dressing in place NVI  POD #1 s/p ACDF, doing well  - encourage ambulation - Percocet for pain, Robaxin for muscle spasms - d/c home today with f/u in 2 weeks

## 2021-02-25 ENCOUNTER — Encounter (HOSPITAL_COMMUNITY): Payer: Self-pay | Admitting: Orthopedic Surgery

## 2021-04-18 NOTE — Discharge Summary (Signed)
Patient ID: Ivannia Willhelm MRN: [id number] DOB/AGE: [birth year] 75 y.o.  Admit date: 02/21/2021 Discharge date: 02/22/2021  Admission Diagnoses:  Active Problems:   Myelopathy 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 female who was admitted 02/21/2021 for operative treatment of myelopathy. Patient has severe unremitting pain that affects sleep, daily activities, and work/hobbies. After pre-op clearance the patient was taken to the operating room on 02/21/2021 and underwent  Procedure(s): ANTERIOR CERVICAL DECOMPRESSION FUSION CERVICAL 3- CERVICAL 4, CERVICAL 4- CERVICAL 5, CERVICAL 5- CERVICAL 6 , CERVICAL 6- CERVICAL 7 WITH INSTRUMENTATION AND ALLOGRAFT.    Patient was given perioperative antibiotics:  Anti-infectives (From admission, onward)    Start     Dose/Rate Route Frequency Ordered Stop   02/21/21 1600  ceFAZolin (ANCEF) IVPB 2g/100 mL premix        2 g 200 mL/hr over 30 Minutes Intravenous Every 8 hours 02/21/21 1305 02/21/21 2357   02/21/21 1305  valACYclovir (VALTREX) tablet 500 mg  Status:  Discontinued        500 mg Oral 2 times daily PRN 02/21/21 1305 02/22/21 1746   02/21/21 0600  ceFAZolin (ANCEF) IVPB 2g/100 mL premix        2 g 200 mL/hr over 30 Minutes Intravenous On call to O.R. 02/21/21 3299 02/21/21 0750        Patient  was given sequential compression devices, early ambulation to prevent DVT.  Patient benefited maximally from hospital stay and there were no complications.    Recent vital signs: BP (!) 171/87 (BP Location: Right Arm)   Pulse 60   Temp (!) 97.5 F (36.4 C) (Oral)   Resp 20   Ht 4\' 10"  (1.473 m)   Wt 44.5 kg   SpO2 97%   BMI 20.48 kg/m    Discharge Medications:   Allergies as of 02/22/2021   No Known Allergies      Medication List     STOP taking these medications    acetaminophen 500 MG tablet Commonly known as: TYLENOL       TAKE these medications    amLODipine 5 MG tablet Commonly known as: NORVASC Take 5 mg by mouth daily.   fluticasone 50 MCG/ACT nasal spray Commonly known as: FLONASE Place 2 sprays into both nostrils daily as needed for allergies or rhinitis.   losartan 100 MG tablet Commonly known as: COZAAR Take 100 mg by mouth daily.   methocarbamol 500 MG tablet Commonly known as: ROBAXIN Take 1 tablet (500 mg total) by mouth every 6 (six) hours as needed for muscle spasms.   oxyCODONE-acetaminophen 5-325 MG tablet Commonly known as: PERCOCET/ROXICET Take 1-2 tablets by mouth every 4 (four) hours as needed for moderate pain.   valACYclovir 500 MG tablet Commonly known as: VALTREX Take 500 mg by mouth 2 (two) times daily as  needed (shingles).        Diagnostic Studies: No results found.  Disposition: Discharge disposition: 01-Home or Self Care        POD #1 s/p ACDF, doing well   - encourage ambulation - Percocet for pain, Robaxin for muscle spasms -Scripts for pain sent to pharmacy electronically  -D/C instructions sheet printed and in chart -D/C today  -F/U in office 2 weeks   Signed: Lennie Muckle Dezarae Mcclaran 04/18/2021, 3:45 PM

## 2022-07-24 IMAGING — CT CT CERVICAL SPINE W/ CM
2 series · 10 of 14 positions shown, 12 images · IV contrast (omnipaque)
Comparison: none

CLINICAL DATA: Left upper extremity pain and weakness. No previous
surgery.

EXAM:
CERVICAL MYELOGRAM
CT CERVICAL SPINE WITH INTRATHECAL CONTRAST
TECHNIQUE: An appropriate entry site was determined under fluoroscopy. Operator
donned sterile gloves and mask. Skin site was marked,prepped with
Betadine, and draped in usual sterile fashion, and infiltrated
locally with 1% lidocaine. A 22 gauge spinal needle was advanced
into the thecal sac at L3 from a right parasagittal approach. Clear
colorless CSF returned. 10 ml Omnipaque 300 were administered
intrathecally for cervical myelography, followed by axial CT
scanning of the cervical spine. I personally performed the lumbar
puncture and administered the intrathecal contrast. I also
personally supervised acquisition of the myelogram images. Coronal
and sagittal reconstructions were generated.

[Series 2: cspine soft · axial · 0.33mm/px · z∈[+618,+748]mm · 5 of 99 slices shown, 7 images]
[im 17/99  soft-tissue]
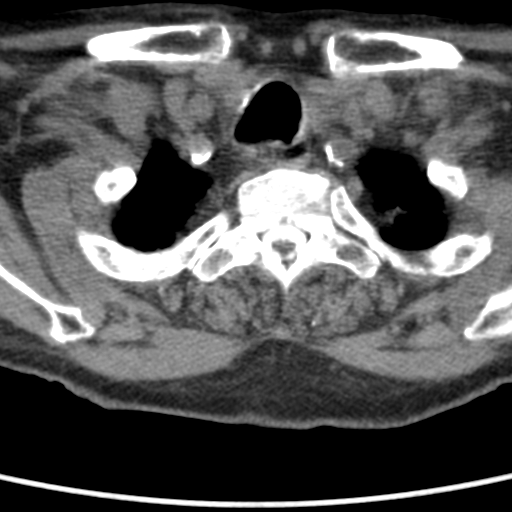
[im 17/99  bone]
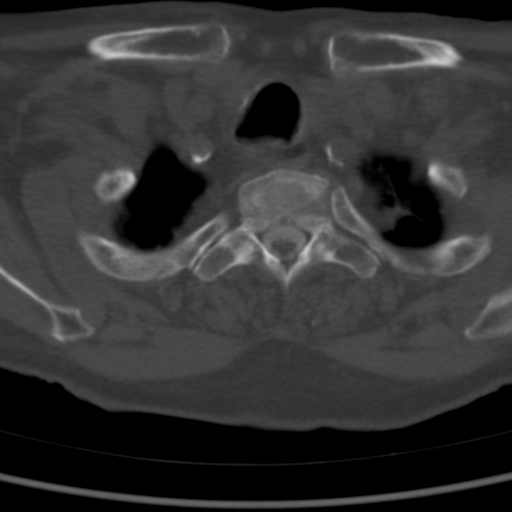
[im 33/99  bone]
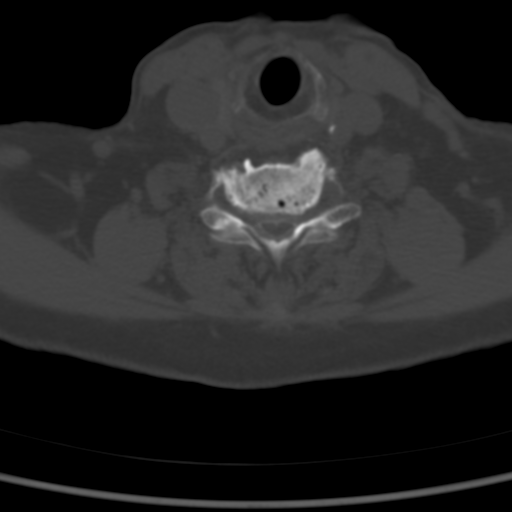
[im 50/99  bone]
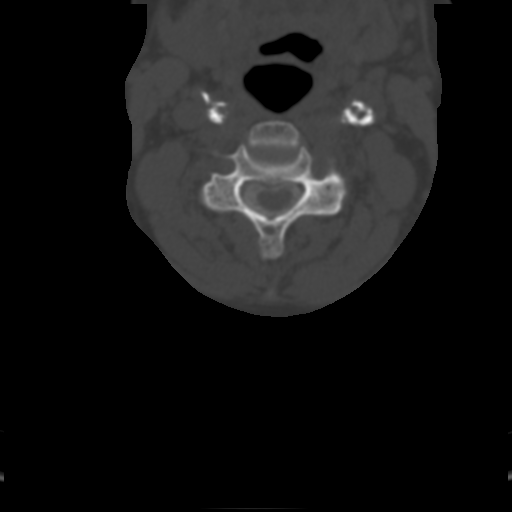
[im 66/99  bone]
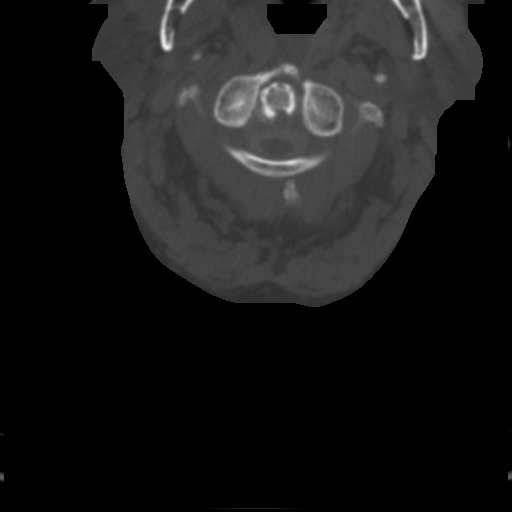
[im 82/99  soft-tissue]
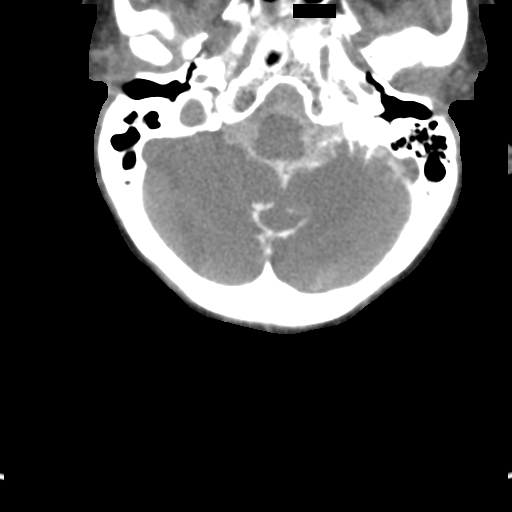
[im 82/99  bone]
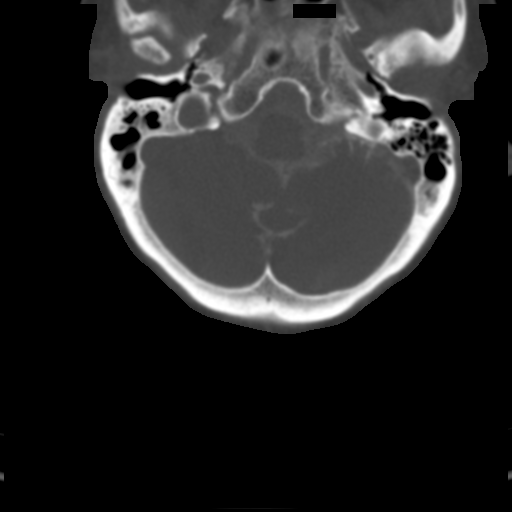

[Series 8: angled axial soft · axial · 0.29mm/px · z∈[+600,+729]mm · 5 of 100 slices shown]
[im 17/100  soft-tissue]
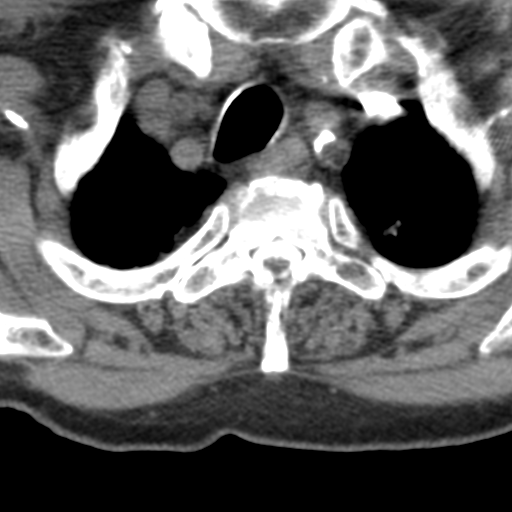
[im 34/100  soft-tissue]
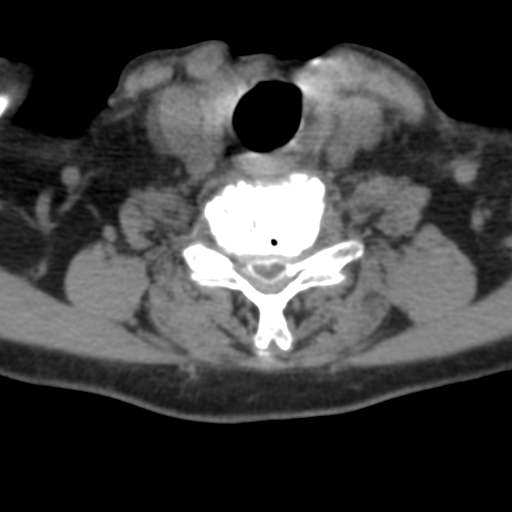
[im 50/100  soft-tissue]
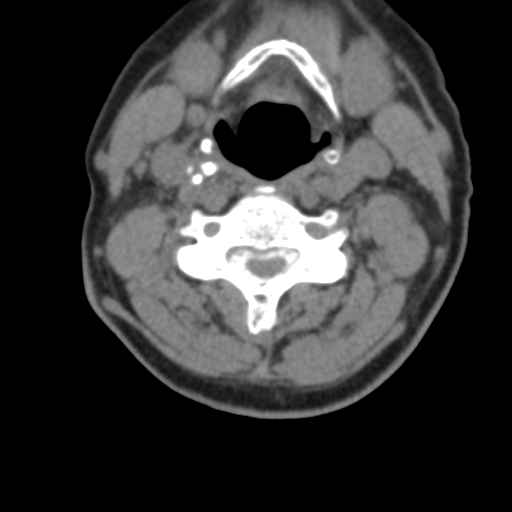
[im 67/100  soft-tissue]
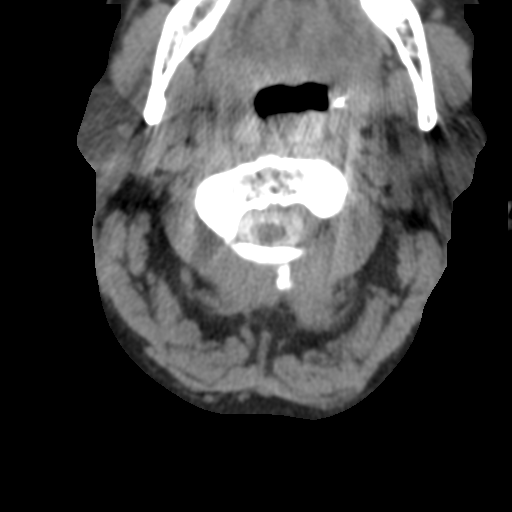
[im 83/100  soft-tissue]
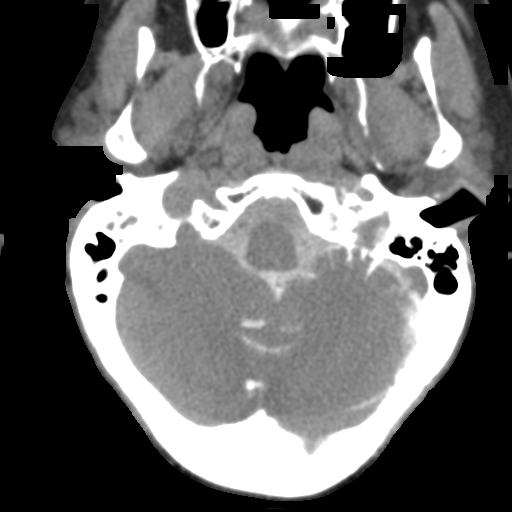

[10 of 14 positions shown; findings below may reference images not displayed]

FINDINGS: Reversal of the normal cervical lordosis. No fracture or worrisome
bone lesion. No prevertebral soft tissue swelling or canal hematoma.

C2-3: Broad central protrusion without cord contact, distortion or
stenosis. Left facet DJD results in foraminal encroachment.

C3-4: Moderate right paracentral protrusion with anterior cord
distortion. Uncovertebral and facet DJD resulting in left foraminal
stenosis.

C4-5: Narrowing of the interspace. Broad central herniation with
cord flattening and spinal stenosis. No significant osseous
foraminal stenosis.

C5-6: Moderate narrowing of the interspace with circumferential disc
bulge and osteophytic spurring. There is a right posterolateral and
foraminal broad protrusion with cord flattening and spinal stenosis.
Bilateral foraminal stenosis right worse than left.

C6-7: Moderate narrowing of the interspace with circumferential disc
bulge and osteophytic ridging. There is a small central protrusion
with mild cord flattening and mild to moderate spinal stenosis. Mild
osseous foraminal encroachment right greater than left.

C7-T1: Mild narrowing of the interspace. Small right paracentral
protrusion, partially calcified, without cord distortion or contact.
No spinal stenosis. Uncovertebral spurring results in foraminal
encroachment left greater than right.

Left subclavian transvenous pacing leads are partially visualized.

Heavily calcified bilateral carotid bifurcation plaque.
IMPRESSION: 1. Left foraminal encroachment C2-3. Broad central protrusion
without compressive pathology.
2. Right paracentral protrusion C3-4 with cord flattening, and left
foraminal stenosis.
3. Spinal stenosis and cord flattening C4-5 secondary to broad
central disc herniation.
4. Spinal stenosis and asymmetric cord flattening C5-6 secondary to
broad right central protrusion.
5. Mild multifactorial spinal stenosis C6-7 with mild cord
flattening.
6. Bilateral foraminal encroachment C7-T1. Small right paracentral
protrusion without compressive pathology.
7. Bilateral carotid bifurcation plaque.

## 2022-09-21 IMAGING — RF DG CERVICAL SPINE 2 OR 3 VIEWS
1 series · 2 of 2 positions shown · non-contrast
Comparison: 12/24/2020 CT cervical spine

CLINICAL DATA: ACDF C3-C7 intraoperative

EXAM:
CERVICAL SPINE - 2-3 VIEW

[Series 1: run · 2 of 2 slices shown]
[im 1/2]
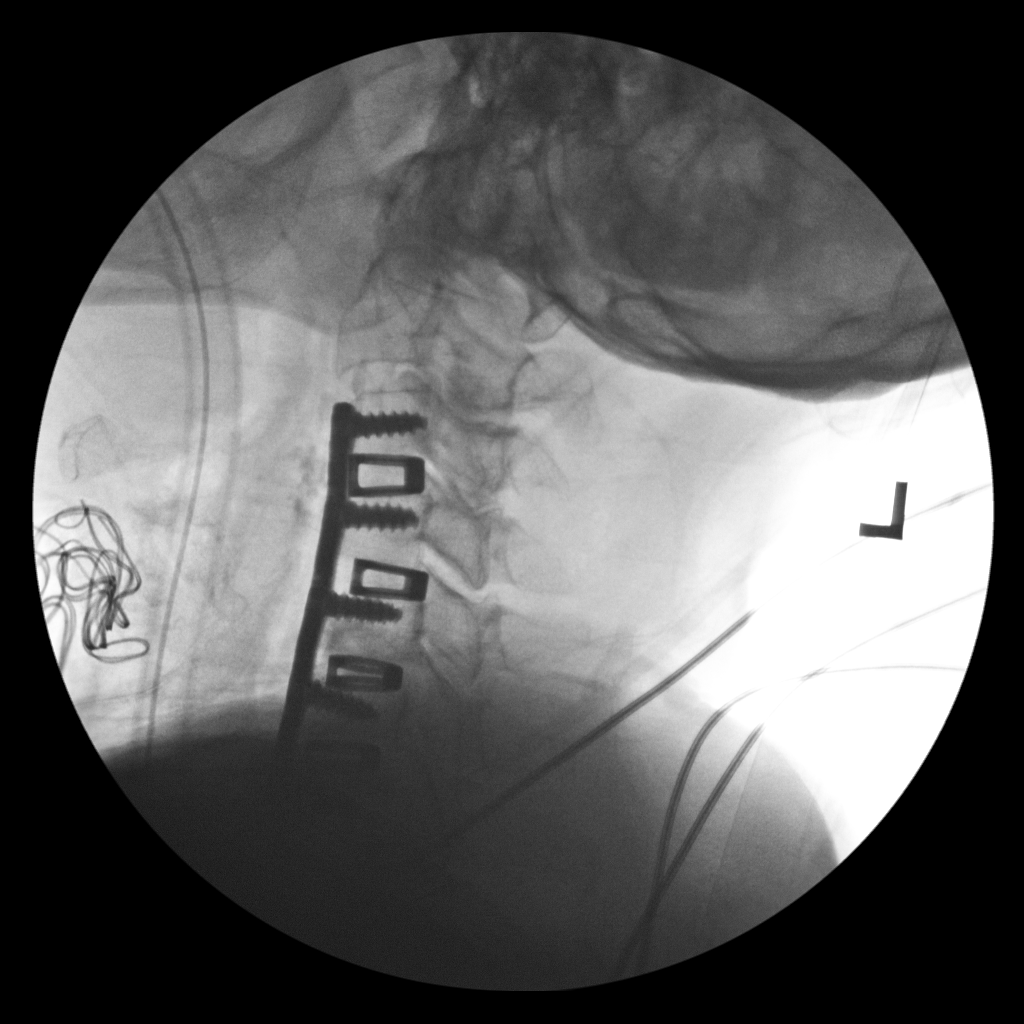
[im 2/2]
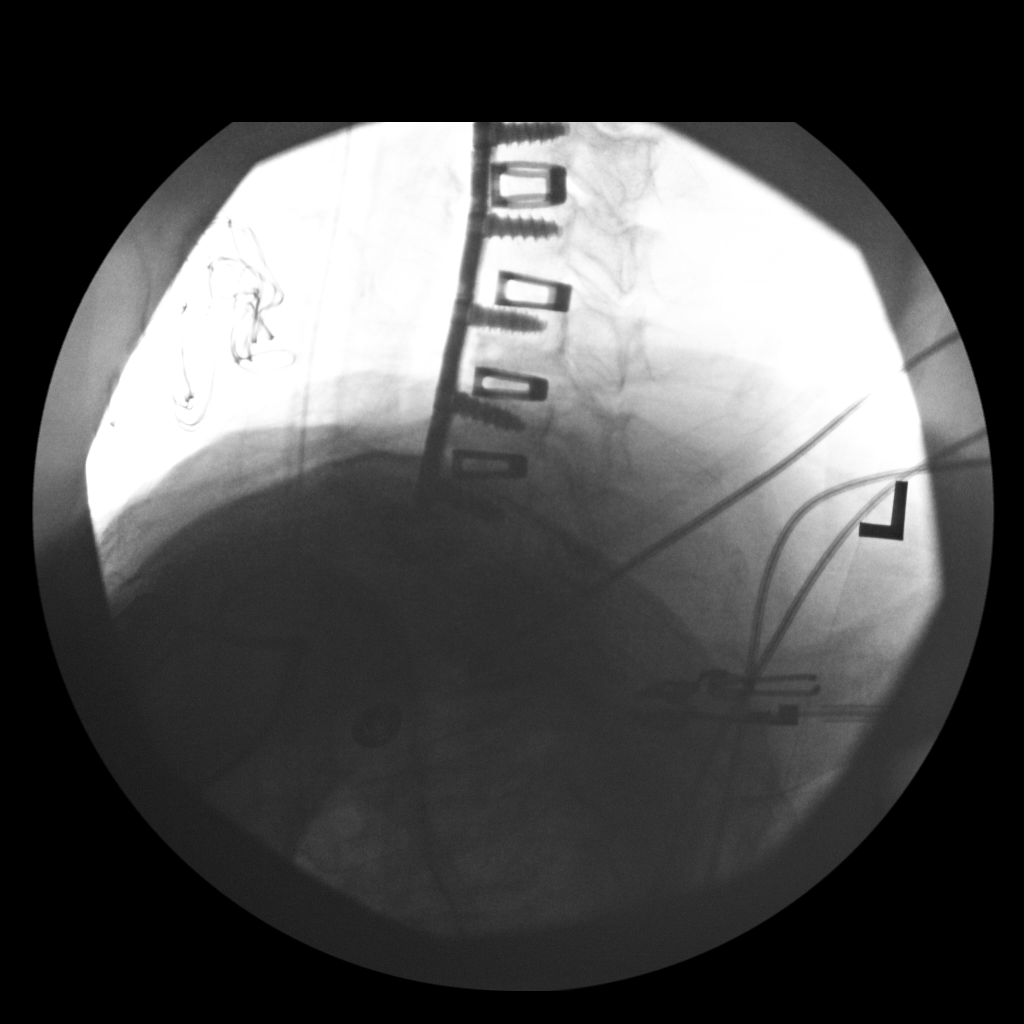

[2 of 2 positions shown; findings below may reference images not displayed]

FINDINGS: The cervical spine is visualized from the skull base through the
superior aspect of C6. Interval placement of ACDF C3-C7, with
interbody disc spacers at each level. The inferior aspect of the
ACDF is obscured by overlying soft tissues. There is no evidence of
cervical spine fracture. No other significant bone abnormalities are
identified.

Fluoro time: 12.3 seconds

2.48 mGy
IMPRESSION: Intraoperative images documenting ACDF C3-C7, without evidence of
acute fracture.

## 2024-05-12 DEATH — deceased
